# Patient Record
Sex: Male | Born: 1980 | Race: Black or African American | Hispanic: No | Marital: Single | Smoking: Current every day smoker
Health system: Southern US, Community
[De-identification: ages and names within clinical notes are randomized; demographics above are authoritative.]

## PROBLEM LIST (undated history)

## (undated) DIAGNOSIS — E7800 Pure hypercholesterolemia, unspecified: Secondary | ICD-10-CM

---

## 2011-09-13 ENCOUNTER — Emergency Department (HOSPITAL_COMMUNITY)
Admission: EM | Admit: 2011-09-13 | Discharge: 2011-09-13 | Disposition: A | Discharge status: Home / Self Care | Payer: Self-pay | Attending: Emergency Medicine | Admitting: Emergency Medicine

## 2011-09-13 DIAGNOSIS — L2989 Other pruritus: Secondary | ICD-10-CM | POA: Insufficient documentation

## 2011-09-13 DIAGNOSIS — L298 Other pruritus: Secondary | ICD-10-CM | POA: Insufficient documentation

## 2011-09-13 DIAGNOSIS — L259 Unspecified contact dermatitis, unspecified cause: Secondary | ICD-10-CM | POA: Insufficient documentation

## 2012-01-20 ENCOUNTER — Emergency Department (HOSPITAL_COMMUNITY)
Admission: EM | Admit: 2012-01-20 | Discharge: 2012-01-20 | Disposition: A | Discharge status: Home Health | Payer: Self-pay | Attending: Emergency Medicine | Admitting: Emergency Medicine

## 2012-01-20 ENCOUNTER — Encounter (HOSPITAL_COMMUNITY): Payer: Self-pay | Admitting: *Deleted

## 2012-01-20 DIAGNOSIS — K089 Disorder of teeth and supporting structures, unspecified: Secondary | ICD-10-CM | POA: Insufficient documentation

## 2012-01-20 DIAGNOSIS — F17200 Nicotine dependence, unspecified, uncomplicated: Secondary | ICD-10-CM | POA: Insufficient documentation

## 2012-01-20 DIAGNOSIS — K047 Periapical abscess without sinus: Secondary | ICD-10-CM | POA: Insufficient documentation

## 2012-01-20 DIAGNOSIS — R220 Localized swelling, mass and lump, head: Secondary | ICD-10-CM | POA: Insufficient documentation

## 2012-01-20 MED ORDER — IBUPROFEN 600 MG PO TABS: 600.0000 mg | ORAL_TABLET | Freq: Four times a day (QID) | ORAL | Status: AC | PRN

## 2012-01-20 MED ORDER — HYDROCODONE-ACETAMINOPHEN 5-325 MG PO TABS
1.0000 | ORAL_TABLET | Freq: Once | ORAL | Status: AC
  Administered 2012-01-20: 1 via ORAL
  Filled 2012-01-20: qty 1

## 2012-01-20 MED ORDER — CLINDAMYCIN HCL 300 MG PO CAPS: 300.0000 mg | ORAL_CAPSULE | Freq: Four times a day (QID) | ORAL | Status: AC

## 2012-01-20 MED ORDER — HYDROCODONE-ACETAMINOPHEN 5-500 MG PO TABS: 1.0000 | ORAL_TABLET | Freq: Four times a day (QID) | ORAL | Status: AC | PRN

## 2012-01-20 MED ORDER — KETOROLAC TROMETHAMINE 60 MG/2ML IM SOLN
60.0000 mg | Freq: Once | INTRAMUSCULAR | Status: AC
  Administered 2012-01-20: 60 mg via INTRAMUSCULAR
  Filled 2012-01-20: qty 2

## 2012-01-20 NOTE — ED Notes (Signed)
Pt here with pain and swelling to right lower jaw related to broken tooth in right lower jaw.  No sob with this.

## 2012-01-20 NOTE — ED Notes (Signed)
Pain improved stated feels better 6/10 achy dental pain.

## 2012-01-20 NOTE — Discharge Instructions (Signed)
Dental Abscess A dental abscess usually starts from an infected tooth. Antibiotic medicine and pain pills can be helpful, but dental infections require the attention of a dentist. Rinse around the infected area often with salt water (a pinch of salt in 8 oz of warm water). Do not apply heat to the outside of your face. See your dentist or oral surgeon as soon as possible.  SEEK IMMEDIATE MEDICAL CARE IF:  You have increasing, severe pain that is not relieved by medicine.   You or your child has an oral temperature above 102 F (38.9 C), not controlled by medicine.   Your baby is older than 3 months with a rectal temperature of 102 F (38.9 C) or higher.   Your baby is 3 months old or younger with a rectal temperature of 100.4 F (38 C) or higher.   You develop chills, severe headache, difficulty breathing, or trouble swallowing.   You have swelling in the neck or around the eye.  Document Released: 10/28/2005 Document Revised: 10/17/2011 Document Reviewed: 04/08/2007 ExitCare Patient Information 2012 [employer]  RESOURCE GUIDE  Dental Problems  Patients with Medicaid: [facility]                     [facility] [address]                                           [address] Phone:  [phone]                                                  Phone:  [phone]  If unable to pay or uninsured, contact:  [facility] or [facility] to become qualified for the adult dental clinic.  Chronic Pain Problems Contact [facility]  [phone] Patients need to be referred by their primary care doctor.  Insufficient Money for Medicine Contact United Way:  call "211" or [facility] 271-5999.  No Primary Care Doctor Call [facility]  [phone] Other agencies that provide inexpensive medical care    [facility]  [phone]    [facility]  [phone]    [facility]   [phone]    [facility]  [phone]    [facility]  [phone]    [facility]  [phone]  Psychological Services [facility]  [phone] [facility]  [phone] [facility]   800 853-5163 (emergency services [phone])  Substance Abuse Resources [facility]  336-882-2125 [facility] 336-784-9470 [facility] 336-285-9073 [facility] 336-845-3988 Residential & Outpatient Substance Abuse Program  800-659-3381  Abuse/Neglect [place] Child Abuse Hotline (336) 641-3795 [place] [facility] Hotline 800-378-5315 (After Hours)  Emergency Shelter [facility] (336) 271-5985  [facility] (336) 275-9566 [facility] (704) [phone]  MRSA Hotline #:   832-7006    [place] [facility]  [facility] [place]     [employer]                          [facility]   Dept. [address] [place]                       [address]      [address]  [place]                                                [place]                            [place] Phone:  [phone]                                   Phone:  [phone]                 Phone:  [phone]  [facility] Phone:  [phone]  [place] Child Abuse Hotline (336) 342-1394 (336) 342-3537 (After Hours)   

## 2012-01-20 NOTE — ED Provider Notes (Signed)
History     CSN: [id number]  Arrival date & time 01/20/12  1532   First MD Initiated Contact with Patient 01/20/12 1732      Chief Complaint  Patient presents with  . Dental Pain    (Consider location/radiation/quality/duration/timing/severity/associated sxs/prior treatment) Patient is a 31 y.o. male presenting with tooth pain. The history is provided by the patient.  Dental PainPrimary symptoms do not include fever or sore throat. The symptoms began 12 to 24 hours ago (yesterday). The symptoms are unchanged. The symptoms are new. The symptoms occur constantly.  Additional symptoms include: gum swelling, gum tenderness and facial swelling. Additional symptoms do not include: trismus, trouble swallowing, excessive salivation, taste disturbance, smell disturbance and drooling. Associated symptoms comments: Pain with eating.  Marland Kitchen     History reviewed. No pertinent past medical history.  History reviewed. No pertinent past surgical history.  No family history on file.  History  Substance Use Topics  . Smoking status: Current Everyday Smoker    Types: Cigarettes  . Smokeless tobacco: Not on file  . Alcohol Use: No      Review of Systems  Constitutional: Negative for fever.  HENT: Positive for facial swelling. Negative for sore throat, drooling, trouble swallowing, neck pain, neck stiffness and voice change.   Gastrointestinal: Negative for nausea, vomiting, abdominal pain and diarrhea.  Neurological: Negative for speech difficulty.  All other systems reviewed and are negative.    Allergies  Review of patient's allergies indicates no known allergies.  Home Medications  No current outpatient prescriptions on file.  BP 144/95  Pulse 93  Temp(Src) 99.5 F (37.5 C) (Oral)  Resp 13  SpO2 98%  Physical Exam  Nursing note and vitals reviewed. Constitutional: He is oriented to person, place, and time. He appears well-developed and well-nourished. No distress.  HENT:    Head: Normocephalic and atraumatic.  Right Ear: External ear normal.  Left Ear: External ear normal.  Mouth/Throat: Oropharynx is clear and moist.       Slight amount of swelling along right mid jaw line. No trismus or drooling. Small area of fluctuance noted in the gumline adjacent to tooth #30. The tube is nontender to palpation. There is no sublingual fullness or tongue elevation. There is no submental fullness, tenderness or firmness.  Eyes: Pupils are equal, round, and reactive to light.  Neck: Normal range of motion. Neck supple.       No cervical or submandibular lymphadenopathy.  Cardiovascular: Normal rate, regular rhythm, normal heart sounds and intact distal pulses.  Exam reveals no gallop and no friction rub.   No murmur heard. Pulmonary/Chest: Effort normal and breath sounds normal. No respiratory distress. He has no wheezes. He has no rales.  Abdominal: Soft. There is no tenderness. There is no rebound and no guarding.  Musculoskeletal: Normal range of motion. He exhibits no edema and no tenderness.  Lymphadenopathy:    He has no cervical adenopathy.  Neurological: He is alert and oriented to person, place, and time.  Skin: Skin is warm and dry. No rash noted. No erythema.  Psychiatric: He has a normal mood and affect. His behavior is normal.    ED Course  Procedures (including critical care time)      1. Dental abscess       MDM  81:68 PM 31 year old male presenting with right lower dental pain since yesterday. He has had progressively worsening swelling with difficulty eating due to the pain. He denies any fever, nausea, vomiting or headache.  He has an area of fluctuance at the gumline of tooth #30 with tenderness and a mild amount of dental decay. There is no lingual swelling, tongue elevation or submental swelling, tenderness or firmness. Picture is not  consistent with Ludwig's angina. Discussed findings with patient. Offered I&D of abscess which patient refused.  He has opted for antibiotics, analgesics and dentistry follow up. Patient was counseled and given very strong return precautions including progression of swelling or sings and symptoms such as tongue swelling/elevation or tenderness and firmness of submental region. Patient was d/c'd with clindamycin and vicodin and given follow up info for dentistry. Patient voiced understanding and was dc'd home in stable condition.        Sheran Luz, MD 01/21/12 [phone]

## 2012-01-21 NOTE — ED Provider Notes (Signed)
I reviewed the resident's note and I agree with the findings and plan.   Celene Kras, MD 01/21/12 [phone]

## 2012-03-20 ENCOUNTER — Encounter (HOSPITAL_COMMUNITY): Payer: Self-pay

## 2012-03-20 ENCOUNTER — Emergency Department (HOSPITAL_COMMUNITY)
Admission: EM | Admit: 2012-03-20 | Discharge: 2012-03-20 | Disposition: A | Discharge status: Home / Self Care | Payer: Self-pay | Attending: Emergency Medicine | Admitting: Emergency Medicine

## 2012-03-20 DIAGNOSIS — IMO0002 Reserved for concepts with insufficient information to code with codable children: Secondary | ICD-10-CM | POA: Insufficient documentation

## 2012-03-20 DIAGNOSIS — Y9269 Other specified industrial and construction area as the place of occurrence of the external cause: Secondary | ICD-10-CM | POA: Insufficient documentation

## 2012-03-20 DIAGNOSIS — T148XXA Other injury of unspecified body region, initial encounter: Secondary | ICD-10-CM

## 2012-03-20 DIAGNOSIS — X500XXA Overexertion from strenuous movement or load, initial encounter: Secondary | ICD-10-CM | POA: Insufficient documentation

## 2012-03-20 DIAGNOSIS — F17200 Nicotine dependence, unspecified, uncomplicated: Secondary | ICD-10-CM | POA: Insufficient documentation

## 2012-03-20 MED ORDER — IBUPROFEN 200 MG PO TABS
400.0000 mg | ORAL_TABLET | Freq: Once | ORAL | Status: AC
  Administered 2012-03-20: 400 mg via ORAL
  Filled 2012-03-20 (×2): qty 2

## 2012-03-20 MED ORDER — ACETAMINOPHEN 500 MG PO TABS
1000.0000 mg | ORAL_TABLET | Freq: Once | ORAL | Status: AC
  Administered 2012-03-20: 975 mg via ORAL
  Filled 2012-03-20: qty 2

## 2012-03-20 MED ORDER — TRAMADOL HCL 50 MG PO TABS: 50.0000 mg | ORAL_TABLET | Freq: Four times a day (QID) | ORAL | Status: AC | PRN

## 2012-03-20 MED ORDER — ACETAMINOPHEN 325 MG PO TABS
ORAL_TABLET | ORAL | Status: AC
  Filled 2012-03-20: qty 3

## 2012-03-20 MED ORDER — NAPROXEN 250 MG PO TABS: 250.0000 mg | ORAL_TABLET | Freq: Two times a day (BID) | ORAL | Status: DC

## 2012-03-20 NOTE — ED Notes (Signed)
Slight swelling noted to right upper extremity, radial pulses present, no recent injury

## 2012-03-20 NOTE — ED Provider Notes (Signed)
History     CSN: [id number]  Arrival date & time 03/20/12  1054   First MD Initiated Contact with Patient 03/20/12 1107      Chief Complaint  Patient presents with  . Arm Pain    HPI Pt was seen at 1105.  Per pt, c/o gradual onset and persistence of constant right forearm "muscles pain" since yesterday.  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  Pt states yesterday his right forearm "hurt," esp when he was at work "lifting and moving stuff."  Did not take anything for pain. Denies joint pain, no direct injury, no rash, no tingling/numbness in extremity, no focal motor weakness.      History reviewed. No pertinent past medical history.  History reviewed. No pertinent past surgical history.   History  Substance Use Topics  . Smoking status: Current Everyday Smoker    Types: Cigarettes  . Smokeless tobacco: Not on file  . Alcohol Use: No    Review of Systems ROS: Statement: All systems negative except as marked or noted in the HPI; Constitutional: Negative for fever and chills. ; ; Eyes: Negative for eye pain, redness and discharge. ; ; ENMT: Negative for ear pain, hoarseness, nasal congestion, sinus pressure and sore throat. ; ; Cardiovascular: Negative for chest pain, palpitations, diaphoresis, dyspnea and peripheral edema. ; ; Respiratory: Negative for cough, wheezing and stridor. ; ; Gastrointestinal: Negative for nausea, vomiting, diarrhea, abdominal pain, blood in stool, hematemesis, jaundice and rectal bleeding. . ; ; Genitourinary: Negative for dysuria, flank pain and hematuria. ; ; Musculoskeletal: +right forearm pain. Negative for back pain and neck pain. Negative for trauma.; ; Skin: Negative for pruritus, rash, abrasions, blisters, bruising and skin lesion.; ; Neuro: Negative for headache, lightheadedness and neck stiffness. Negative for weakness, altered level of consciousness , altered  mental status, extremity weakness, paresthesias, involuntary movement, seizure and syncope.     Allergies  Review of patient's allergies indicates no known allergies.  Home Medications  No current outpatient prescriptions on file.  BP 134/81  Pulse 76  Temp(Src) 98.3 F (36.8 C) (Oral)  Resp 16  SpO2 100%  Physical Exam 1110: Physical examination:  Nursing notes reviewed; Vital signs and O2 SAT reviewed;  Constitutional: Well developed, Well nourished, Well hydrated, In no acute distress; Head:  Normocephalic, atraumatic; Eyes: EOMI, PERRL, No scleral icterus; ENMT: Mouth and pharynx normal, Mucous membranes moist; Neck: Supple, Full range of motion, No lymphadenopathy; Cardiovascular: Regular rate and rhythm, No murmur, rub, or gallop; Respiratory: Breath sounds clear & equal bilaterally, No rales, rhonchi, wheezes, or rub, Normal respiratory effort/excursion; Chest: Nontender, Movement normal; Extremities: No bony tenderness to palp right shoulder, elbow, wrist, or hand.  Motor strength at shoulder normal.  Sensation intact over deltoid region, distal NMS intact with right hand having intact sensation and strength in the distribution of the median, radial, and ulnar nerve function.  Strong radial pulse.  Right forearm compartments soft.  +TTP right dorsal lateral forearm muscles esp when pt performs right forearm ROM of wrist extension and supination which reproduces pt's pain, No edema/erythema/ecchymosis, no open wounds, no deformity. Pulses normal, No edema, No calf edema or asymmetry.; Neuro: AA&Ox3, Major CN grossly intact.  No gross focal motor or sensory deficits in extremities.; Skin: Color normal, Warm, Dry, no rash.    ED Course  Procedures    MDM  MDM Reviewed: nursing note  and vitals     11:20 AM:  Forearm muscle pain began after spending the day cooking on a grill and using his right arm.  No risk factors for DVT.  No direct injury to arm/joints.  No signs of compartment  syndrome.  Appears msk pain at this time, will tx symptomatically.  Dx d/w pt and family.  Questions answered.  Verb understanding, agreeable to d/c home with outpt f/u.         Laray Anger, DO 03/22/12 1702

## 2012-03-20 NOTE — Discharge Instructions (Signed)
RESOURCE GUIDE  Dental Problems  Patients with Medicaid: [facility]                     [facility] [address]                                           [address] Phone:  [phone]                                                  Phone:  [phone]  If unable to pay or uninsured, contact:  [facility] or [facility] to become qualified for the adult dental clinic.  Chronic Pain Problems Contact [facility]  [phone] Patients need to be referred by their primary care doctor.  Insufficient Money for Medicine Contact [employer] Way:  call "211" or [facility] [phone].  No Primary Care Doctor Call [facility]  [phone] Other agencies that provide inexpensive medical care    [facility]  [phone]    [facility]  [phone]    [facility]  [phone]    [facility]  [phone]    [facility]  [phone]    [facility]  [phone]  Psychological Services [facility]  [phone] [facility]  [phone] [facility]   [phone] (emergency services [phone])  Substance Abuse Resources [facility]  336-882-2125 [facility] 336-784-9470 [facility] 336-285-9073 [facility] 336-845-3988 Residential & Outpatient Substance Abuse Program  800-659-3381  Abuse/Neglect [place] Child Abuse Hotline (336) 641-3795 [place] [facility] Hotline 800-378-5315 (After Hours)  Emergency Shelter [facility] (336) 271-5985  [facility] (336) 275-9566 [facility] (704) 372-4663  MRSA Hotline #:   832-7006    [place] [facility]  [facility] [place]     [employer]                          [facility] [address] [place]                       [address]      [address]  [place]                                                [place]                            [place] Phone:  [phone]                                   Phone:  [phone]                 Phone:  [phone]  [facility] Phone:  [phone]    [place] Child Abuse Hotline (336) 342-1394 (336) 342-3537 (After Hours)    Take the prescriptions as directed.  Apply moist heat or ice to the area(s) of discomfort, for 15 minutes at a time, several times per day for the next few days.  Do not fall asleep on a heating or ice pack.  Call your regular medical doctor today to schedule a follow up appointment within the next week.  Return to the Emergency Department immediately if worsening.  

## 2012-03-20 NOTE — ED Notes (Signed)
Patient presents with swelling to right hand and arm since this AM.  Patient denies recent injury, denies pain upon palpation, just reporting pain upon movement. Radial pulses present, skin intact.

## 2013-01-05 ENCOUNTER — Emergency Department (HOSPITAL_COMMUNITY)
Admission: EM | Admit: 2013-01-05 | Discharge: 2013-01-05 | Disposition: A | Discharge status: Home / Self Care | Payer: Self-pay | Attending: Emergency Medicine | Admitting: Emergency Medicine

## 2013-01-05 ENCOUNTER — Emergency Department (HOSPITAL_COMMUNITY): Payer: Self-pay

## 2013-01-05 ENCOUNTER — Encounter (HOSPITAL_COMMUNITY): Payer: Self-pay | Admitting: Emergency Medicine

## 2013-01-05 DIAGNOSIS — Z8639 Personal history of other endocrine, nutritional and metabolic disease: Secondary | ICD-10-CM | POA: Insufficient documentation

## 2013-01-05 DIAGNOSIS — R51 Headache: Secondary | ICD-10-CM | POA: Insufficient documentation

## 2013-01-05 DIAGNOSIS — Z862 Personal history of diseases of the blood and blood-forming organs and certain disorders involving the immune mechanism: Secondary | ICD-10-CM | POA: Insufficient documentation

## 2013-01-05 DIAGNOSIS — R079 Chest pain, unspecified: Secondary | ICD-10-CM | POA: Insufficient documentation

## 2013-01-05 DIAGNOSIS — F17200 Nicotine dependence, unspecified, uncomplicated: Secondary | ICD-10-CM | POA: Insufficient documentation

## 2013-01-05 DIAGNOSIS — E7800 Pure hypercholesterolemia, unspecified: Secondary | ICD-10-CM | POA: Insufficient documentation

## 2013-01-05 HISTORY — DX: Pure hypercholesterolemia, unspecified: E78.00

## 2013-01-05 LAB — CBC WITH DIFFERENTIAL/PLATELET
Basophils Absolute: 0 10*3/uL (ref 0.0–0.1)
HCT: 43.7 % (ref 39.0–52.0)
Lymphocytes Relative: 34 % (ref 12–46)
Neutro Abs: 2 10*3/uL (ref 1.7–7.7)
Neutrophils Relative %: 53 % (ref 43–77)
Platelets: 300 10*3/uL (ref 150–400)
RDW: 12.6 % (ref 11.5–15.5)
WBC: 3.7 10*3/uL — ABNORMAL LOW (ref 4.0–10.5)

## 2013-01-05 LAB — BASIC METABOLIC PANEL
CO2: 25 mEq/L (ref 19–32)
Chloride: 100 mEq/L (ref 96–112)
Potassium: 4.5 mEq/L (ref 3.5–5.1)
Sodium: 136 mEq/L (ref 135–145)

## 2013-01-05 LAB — POCT I-STAT TROPONIN I: Troponin i, poc: 0 ng/mL (ref 0.00–0.08)

## 2013-01-05 MED ORDER — PROCHLORPERAZINE EDISYLATE 5 MG/ML IJ SOLN
10.0000 mg | Freq: Once | INTRAMUSCULAR | Status: AC
  Administered 2013-01-05: 10 mg via INTRAVENOUS
  Filled 2013-01-05: qty 2

## 2013-01-05 MED ORDER — DEXAMETHASONE SODIUM PHOSPHATE 10 MG/ML IJ SOLN
10.0000 mg | Freq: Once | INTRAMUSCULAR | Status: AC
  Administered 2013-01-05: 10 mg via INTRAVENOUS
  Filled 2013-01-05: qty 1

## 2013-01-05 MED ORDER — DIPHENHYDRAMINE HCL 50 MG/ML IJ SOLN
25.0000 mg | Freq: Once | INTRAMUSCULAR | Status: AC
  Administered 2013-01-05: 25 mg via INTRAVENOUS
  Filled 2013-01-05: qty 1

## 2013-01-05 MED ORDER — KETOROLAC TROMETHAMINE 30 MG/ML IJ SOLN
30.0000 mg | Freq: Once | INTRAMUSCULAR | Status: AC
  Administered 2013-01-05: 30 mg via INTRAVENOUS
  Filled 2013-01-05: qty 1

## 2013-01-05 MED ORDER — SODIUM CHLORIDE 0.9 % IV BOLUS (SEPSIS)
1000.0000 mL | Freq: Once | INTRAVENOUS | Status: AC
  Administered 2013-01-05: 1000 mL via INTRAVENOUS

## 2013-01-05 NOTE — ED Notes (Signed)
Headache sl better directly after receving the meds

## 2013-01-05 NOTE — ED Notes (Signed)
The pt has a headache and chest pain.  He has a history of headaches.  Iv nss and med given

## 2013-01-05 NOTE — ED Notes (Signed)
Pt c/o feeling lightheaded and having HA upon waking today; pt sts some intermittent CP; pt sts hx of similar in past

## 2013-01-05 NOTE — ED Provider Notes (Signed)
History     CSN: [id number]  Arrival date & time 01/05/13  1155   First MD Initiated Contact with Patient 01/05/13 1457      Chief Complaint  Patient presents with  . Chest Pain  . Headache    HPI William Walter is a 32 y.o. male who presents to the ED for concern of headache.  Reports dull throbbing headache over L eye.  Has had these headaches numerous times in the past.  Happens approximately 2x/week.  This one was one of the worse ones however.  Same characteristics as normal headache.  No f/c/ns.  No neck stiffness.  No seizures.  No vision changes.  No weakness/numbness.  No other symptoms.  Patient also says that he has intermittent sharp chest pain that happens with headaches.  R sided.  Lasts less than 5 minutes.  Also very typical and characteristic of headaches and happens at the same side.    Past Medical History  Diagnosis Date  . Hypercholesteremia     History reviewed. No pertinent past surgical history.  Family History: Reviewed.  Migraines in mother.    History  Substance Use Topics  . Smoking status: Current Every Day Smoker    Types: Cigarettes  . Smokeless tobacco: Not on file  . Alcohol Use: Yes     Comment: occ      Review of Systems  Constitutional: Negative for fever and chills.  HENT: Negative for congestion, sore throat and neck pain.   Respiratory: Negative for cough.   Cardiovascular: Positive for chest pain.  Gastrointestinal: Negative for nausea, vomiting, abdominal pain, diarrhea and constipation.  Endocrine: Negative for polyuria.  Genitourinary: Negative for dysuria and hematuria.  Skin: Negative for rash.  Neurological: Positive for headaches. Negative for dizziness, tremors, seizures, syncope, facial asymmetry, speech difficulty, weakness, light-headedness and numbness.  Psychiatric/Behavioral: Negative.   All other systems reviewed and are negative.    Allergies  Review of patient's allergies indicates no known  allergies.  Home Medications  No current outpatient prescriptions on file.  BP 134/79  Pulse 84  Temp(Src) 98.1 F (36.7 C) (Oral)  Resp 18  SpO2 97%  Physical Exam  Nursing note and vitals reviewed. Constitutional: He is oriented to person, place, and time. He appears well-developed and well-nourished. No distress.  HENT:  Head: Normocephalic and atraumatic.  Right Ear: External ear normal.  Left Ear: External ear normal.  Mouth/Throat: Oropharynx is clear and moist. No oropharyngeal exudate.  Eyes: Conjunctivae are normal. Pupils are equal, round, and reactive to light. Right eye exhibits no discharge.  Neck: Normal range of motion. Neck supple. No tracheal deviation present.  Cardiovascular: Normal rate, regular rhythm and intact distal pulses.   Pulmonary/Chest: Effort normal. No respiratory distress. He has no wheezes. He has no rales.  Abdominal: Soft. He exhibits no distension. There is no tenderness. There is no rebound and no guarding.  Musculoskeletal: Normal range of motion.  Neurological: He is alert and oriented to person, place, and time. He has normal strength and normal reflexes. No cranial nerve deficit or sensory deficit. He displays a negative Romberg sign. Coordination and gait normal. GCS eye subscore is 4. GCS verbal subscore is 5. GCS motor subscore is 6.  Skin: Skin is warm and dry. No rash noted. He is not diaphoretic.  Psychiatric: He has a normal mood and affect.    ED Course  Procedures (including critical care time)  Labs Reviewed  CBC WITH DIFFERENTIAL - Abnormal; Notable for  the following:    WBC 3.7 (*)    All other components within normal limits  BASIC METABOLIC PANEL - Abnormal; Notable for the following:    GFR calc non Af Amer 82 (*)    All other components within normal limits  POCT I-STAT TROPONIN I   Dg Chest 2 View  01/05/2013  *RADIOLOGY REPORT*  Clinical Data: Chest pain  CHEST - 2 VIEW  Comparison: None.  Findings:  Lungs clear.   Heart size and pulmonary vascularity are normal.  No adenopathy.  No bone lesions.  No pneumothorax.  IMPRESSION: No abnormality noted.   Original Report Authenticated By: Bretta Bang, M.D.     Date: 01/05/2013  Rate: 70  Rhythm: normal sinus rhythm  QRS Axis: normal  Intervals: normal  ST/T Wave abnormalities: normal and early repolarization  Conduction Disutrbances:none  Narrative Interpretation:   Old EKG Reviewed: none available    1. Headache       MDM  William Walter is a 32 y.o. male who presents to the ED with headache and CP.  CP low risk and sounds to be a side effect of headache.  Not having CP on my exam.  Headache sounds to be c/w undiagnosed migraines.  Family history, unilateral, recurring.  No neuro deficits to indicate stroke.  Atypical story for [facility].  Vision unchanged.  Doubt acute glaucoma.  CP not described as ACS, PE, or other life threatening cause of CP.  EKG, CXR WNL.  Migraine cocktail given and headache improved to 0/10 in severity.  Patient safe for discharge.  Patient currently seeking PCP.  Resource list provided and encouraged patient to f/u regarding further workup.  Patient discharged.       Arloa Koh, MD 01/05/13 1836

## 2013-01-06 NOTE — ED Provider Notes (Signed)
I  reviewed the resident's note and I agree with the findings and plan.     Justice Aguirre L Branson Kranz, MD 01/06/13 1515 

## 2013-09-01 IMAGING — CR DG CHEST 2V
2 series · 2 of 2 positions shown · non-contrast
Comparison: None.

CLINICAL DATA: Chest pain

CHEST - 2 VIEW

[w chest pa]
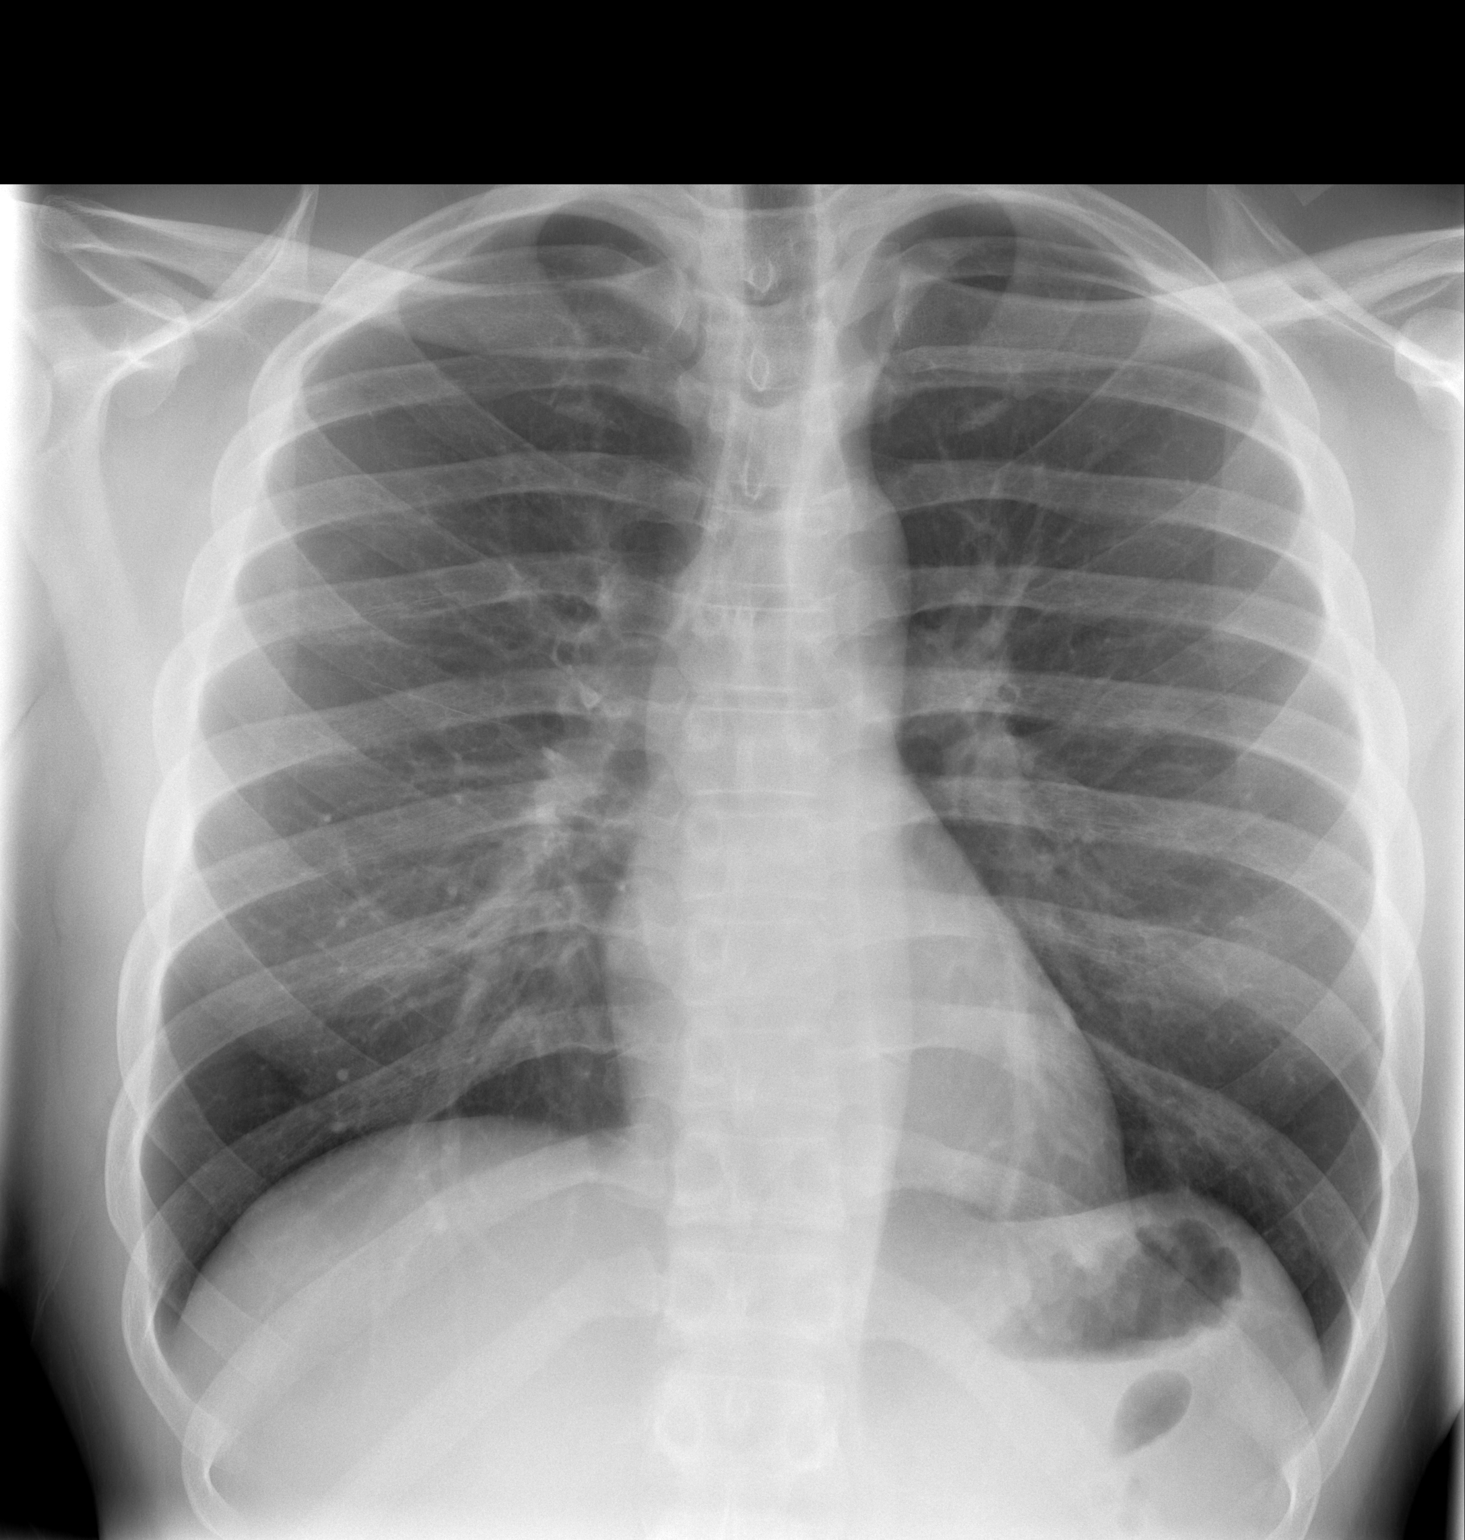

[w chest lat]
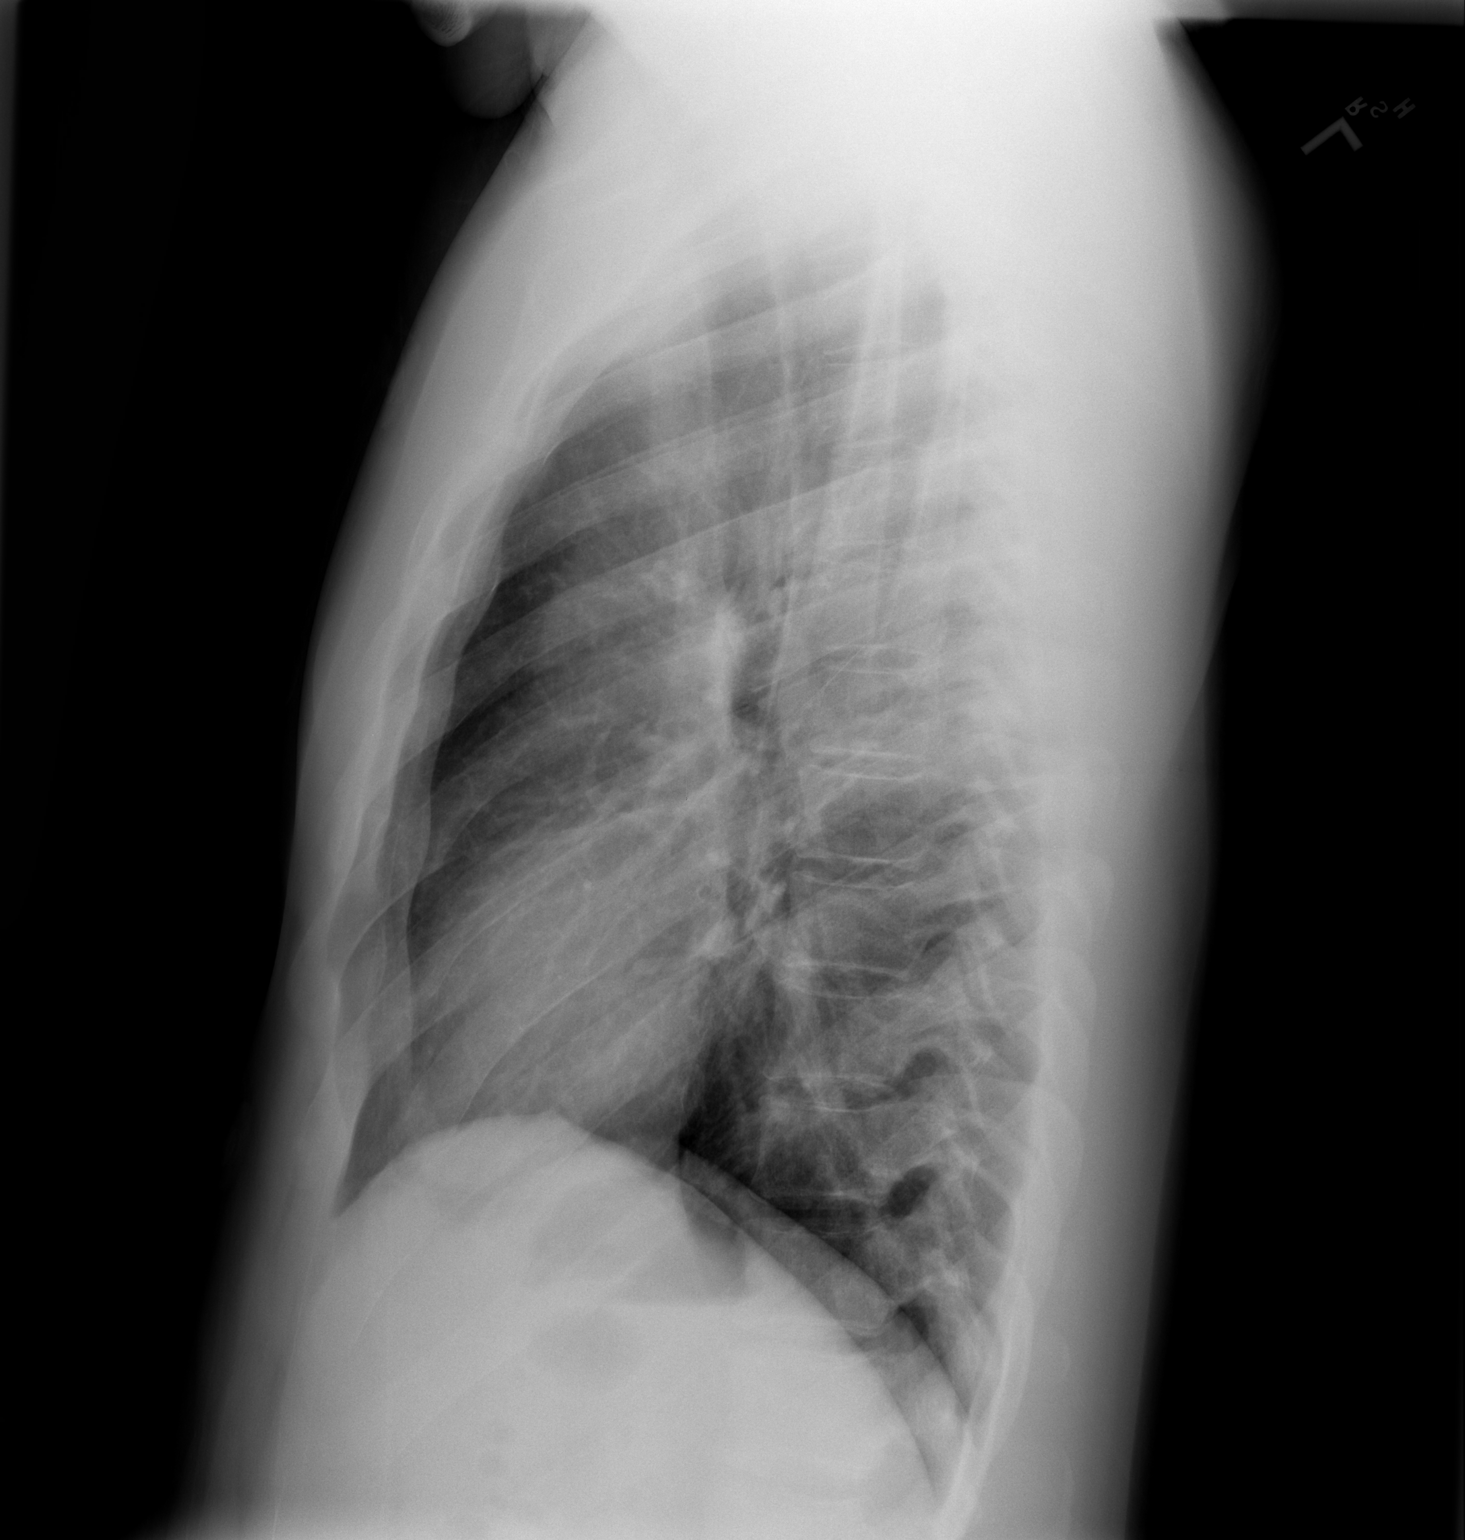

[2 of 2 positions shown; findings below may reference images not displayed]

FINDINGS: ] Lungs clear.  Heart size and pulmonary vascularity are
normal.  No adenopathy.  No bone lesions.  No pneumothorax.
IMPRESSION: No abnormality noted.

## 2013-11-06 ENCOUNTER — Encounter (HOSPITAL_COMMUNITY): Payer: Self-pay | Admitting: Emergency Medicine

## 2013-11-06 ENCOUNTER — Emergency Department (HOSPITAL_COMMUNITY)
Admission: EM | Admit: 2013-11-06 | Discharge: 2013-11-06 | Disposition: A | Discharge status: Home / Self Care | Payer: Self-pay | Attending: Emergency Medicine | Admitting: Emergency Medicine

## 2013-11-06 DIAGNOSIS — Y929 Unspecified place or not applicable: Secondary | ICD-10-CM | POA: Insufficient documentation

## 2013-11-06 DIAGNOSIS — X503XXA Overexertion from repetitive movements, initial encounter: Secondary | ICD-10-CM | POA: Insufficient documentation

## 2013-11-06 DIAGNOSIS — Z862 Personal history of diseases of the blood and blood-forming organs and certain disorders involving the immune mechanism: Secondary | ICD-10-CM | POA: Insufficient documentation

## 2013-11-06 DIAGNOSIS — S335XXA Sprain of ligaments of lumbar spine, initial encounter: Secondary | ICD-10-CM | POA: Insufficient documentation

## 2013-11-06 DIAGNOSIS — Z8639 Personal history of other endocrine, nutritional and metabolic disease: Secondary | ICD-10-CM | POA: Insufficient documentation

## 2013-11-06 DIAGNOSIS — Y9389 Activity, other specified: Secondary | ICD-10-CM | POA: Insufficient documentation

## 2013-11-06 DIAGNOSIS — M549 Dorsalgia, unspecified: Secondary | ICD-10-CM

## 2013-11-06 MED ORDER — IBUPROFEN 800 MG PO TABS: 800.0000 mg | ORAL_TABLET | Freq: Three times a day (TID) | ORAL | Status: DC

## 2013-11-06 MED ORDER — HYDROCODONE-ACETAMINOPHEN 5-325 MG PO TABS: 1.0000 | ORAL_TABLET | ORAL | Status: DC | PRN

## 2013-11-06 MED ORDER — CYCLOBENZAPRINE HCL 10 MG PO TABS: 10.0000 mg | ORAL_TABLET | Freq: Two times a day (BID) | ORAL | Status: DC | PRN

## 2013-11-06 MED ORDER — HYDROCODONE-ACETAMINOPHEN 5-325 MG PO TABS
1.0000 | ORAL_TABLET | Freq: Once | ORAL | Status: AC
  Administered 2013-11-06: 1 via ORAL
  Filled 2013-11-06: qty 1

## 2013-11-06 NOTE — ED Provider Notes (Signed)
CSN: [id number]     Arrival date & time 11/06/13  1409 History   This chart was scribed for non-physician practitioner Allean Found, PA-C working with Hurman Horn, MD by Valera Castle, ED scribe. This patient was seen in room TR08C/TR08C and the patient's care was started at 4:52 PM.   Chief Complaint  Patient presents with  . Back Pain   The history is provided by the patient. No language interpreter was used.   HPI Comments: William Walter is a 32 y.o. male who presents to the Emergency Department complaining of sudden, moderate, intermittent, lower back pain, onset a few days ago. He reports that he helped push a car out of the mud and thinks the back pain came from that. He reports pain with certain movement, including bending over. He denies abdominal pain, urinary symptoms, and any other associated symptoms. He denies any medical history.   PCP - Default, Provider, MD  Past Medical History  Diagnosis Date  . Hypercholesteremia    History reviewed. No pertinent past surgical history. History reviewed. No pertinent family history. History  Substance Use Topics  . Smoking status: Current Every Day Smoker    Types: Cigarettes  . Smokeless tobacco: Not on file  . Alcohol Use: Yes     Comment: occ    Review of Systems  Gastrointestinal: Negative for abdominal pain.  Genitourinary: Negative.  Negative for dysuria.  Musculoskeletal: Positive for back pain (lower) and myalgias. Negative for gait problem.  All other systems reviewed and are negative.    Allergies  Review of patient's allergies indicates no known allergies.  Home Medications   Current Outpatient Rx  Name  Route  Sig  Dispense  Refill  . ibuprofen (ADVIL,MOTRIN) 200 MG tablet   Oral   Take 600 mg by mouth every 4 (four) hours as needed.          BP 126/78  Pulse 88  Temp(Src) 98.8 F (37.1 C) (Oral)  Resp 20  Wt 216 lb 3 oz (98.062 kg)  SpO2 99%  Physical Exam  Nursing note and vitals  reviewed. Constitutional: He is oriented to person, place, and time. He appears well-developed and well-nourished. No distress.  HENT:  Head: Normocephalic and atraumatic.  Eyes: EOM are normal.  Neck: Neck supple. No tracheal deviation present.  Cardiovascular: Normal rate.   Pulmonary/Chest: Effort normal. No respiratory distress.  Abdominal: Soft. There is no tenderness.  Musculoskeletal: Normal range of motion.  Right para lumbar tenderness.   Neurological: He is alert and oriented to person, place, and time.  Equal LE strength.   Skin: Skin is warm and dry.  Psychiatric: He has a normal mood and affect. His behavior is normal.    ED Course  Procedures (including critical care time)  DIAGNOSTIC STUDIES: Oxygen Saturation is 99% on room air, normal by my interpretation.    COORDINATION OF CARE: 4:53 PM-Discussed treatment plan which includes clinical suspicion of muscle strain with pt at bedside and pt agreed to plan.   Labs Review Labs Reviewed - No data to display Imaging Review No results found.  EKG Interpretation   None      Meds ordered this encounter  Medications  . ibuprofen (ADVIL,MOTRIN) 200 MG tablet    Sig: Take 600 mg by mouth every 4 (four) hours as needed.    MDM  No diagnosis found. 1. Back strain  Uncomplicated back strain injury.  I personally performed the services described in this documentation, which was  scribed in my presence. The recorded information has been reviewed and is accurate.     Arnoldo Hooker, PA-C 11/06/13 1715

## 2013-11-06 NOTE — ED Notes (Signed)
Per pt sts Christmas Eve he was helping push a car out of the mudd and he thinks he injured his back. sts lower back pain with movement.

## 2013-11-06 NOTE — ED Notes (Signed)
PT ambulatory to room

## 2013-11-08 NOTE — ED Provider Notes (Signed)
Medical screening examination/treatment/procedure(s) were performed by non-physician practitioner and as supervising physician I was immediately available for consultation/collaboration.   Aswad Wandrey M Donika Butner, MD 11/08/13 1410 

## 2014-03-16 ENCOUNTER — Encounter (HOSPITAL_COMMUNITY): Payer: Self-pay | Admitting: Emergency Medicine

## 2014-03-16 ENCOUNTER — Emergency Department (HOSPITAL_COMMUNITY)
Admission: EM | Admit: 2014-03-16 | Discharge: 2014-03-17 | Disposition: A | Discharge status: Home / Self Care | Payer: Self-pay | Attending: Emergency Medicine | Admitting: Emergency Medicine

## 2014-03-16 DIAGNOSIS — T4271XA Poisoning by unspecified antiepileptic and sedative-hypnotic drugs, accidental (unintentional), initial encounter: Secondary | ICD-10-CM | POA: Insufficient documentation

## 2014-03-16 DIAGNOSIS — E7800 Pure hypercholesterolemia, unspecified: Secondary | ICD-10-CM

## 2014-03-16 DIAGNOSIS — G4700 Insomnia, unspecified: Secondary | ICD-10-CM

## 2014-03-16 DIAGNOSIS — Z862 Personal history of diseases of the blood and blood-forming organs and certain disorders involving the immune mechanism: Secondary | ICD-10-CM | POA: Insufficient documentation

## 2014-03-16 DIAGNOSIS — T4272XA Poisoning by unspecified antiepileptic and sedative-hypnotic drugs, intentional self-harm, initial encounter: Secondary | ICD-10-CM | POA: Insufficient documentation

## 2014-03-16 DIAGNOSIS — F17200 Nicotine dependence, unspecified, uncomplicated: Secondary | ICD-10-CM | POA: Insufficient documentation

## 2014-03-16 DIAGNOSIS — F1910 Other psychoactive substance abuse, uncomplicated: Secondary | ICD-10-CM

## 2014-03-16 DIAGNOSIS — Z8639 Personal history of other endocrine, nutritional and metabolic disease: Secondary | ICD-10-CM | POA: Insufficient documentation

## 2014-03-16 DIAGNOSIS — R45851 Suicidal ideations: Secondary | ICD-10-CM

## 2014-03-16 DIAGNOSIS — T50902A Poisoning by unspecified drugs, medicaments and biological substances, intentional self-harm, initial encounter: Secondary | ICD-10-CM

## 2014-03-16 DIAGNOSIS — T426X2A Poisoning by other antiepileptic and sedative-hypnotic drugs, intentional self-harm, initial encounter: Secondary | ICD-10-CM | POA: Insufficient documentation

## 2014-03-16 LAB — RAPID URINE DRUG SCREEN, HOSP PERFORMED
AMPHETAMINES: NOT DETECTED
BENZODIAZEPINES: NOT DETECTED
Barbiturates: NOT DETECTED
Cocaine: POSITIVE — AB
OPIATES: NOT DETECTED
TETRAHYDROCANNABINOL: POSITIVE — AB

## 2014-03-16 LAB — ACETAMINOPHEN LEVEL

## 2014-03-16 LAB — ETHANOL: Alcohol, Ethyl (B): <11 mg/dL (ref 0–11)

## 2014-03-16 LAB — COMPREHENSIVE METABOLIC PANEL
ALK PHOS: 53 U/L (ref 39–117)
ALT: 31 U/L (ref 0–53)
AST: 43 U/L — ABNORMAL HIGH (ref 0–37)
Albumin: 4.6 g/dL (ref 3.5–5.2)
BUN: 8 mg/dL (ref 6–23)
CO2: 24 mEq/L (ref 19–32)
CREATININE: 1.14 mg/dL (ref 0.50–1.35)
Calcium: 9.5 mg/dL (ref 8.4–10.5)
Chloride: 99 mEq/L (ref 96–112)
GFR calc Af Amer: >90 mL/min (ref >90)
GFR calc non Af Amer: 84 mL/min — ABNORMAL LOW (ref >90)
GLUCOSE: 84 mg/dL (ref 70–99)
POTASSIUM: 4 meq/L (ref 3.7–5.3)
Sodium: 139 mEq/L (ref 137–147)
TOTAL PROTEIN: 8.7 g/dL — AB (ref 6.0–8.3)
Total Bilirubin: 1.2 mg/dL (ref 0.3–1.2)

## 2014-03-16 LAB — CBC
HEMATOCRIT: 42.6 % (ref 39.0–52.0)
HEMOGLOBIN: 14.8 g/dL (ref 13.0–17.0)
MCH: 30.2 pg (ref 26.0–34.0)
MCHC: 34.7 g/dL (ref 30.0–36.0)
MCV: 86.9 fL (ref 78.0–100.0)
Platelets: 269 10*3/uL (ref 150–400)
RBC: 4.9 MIL/uL (ref 4.22–5.81)
RDW: 12.8 % (ref 11.5–15.5)
WBC: 3.6 10*3/uL — ABNORMAL LOW (ref 4.0–10.5)

## 2014-03-16 LAB — SALICYLATE LEVEL: Salicylate Lvl: <2 mg/dL — ABNORMAL LOW (ref 2.8–20.0)

## 2014-03-16 MED ORDER — IBUPROFEN 200 MG PO TABS: 600.0000 mg | ORAL_TABLET | Freq: Three times a day (TID) | ORAL | Status: DC | PRN

## 2014-03-16 MED ORDER — ONDANSETRON HCL 4 MG PO TABS: 4.0000 mg | ORAL_TABLET | Freq: Three times a day (TID) | ORAL | Status: DC | PRN

## 2014-03-16 MED ORDER — ACETAMINOPHEN 325 MG PO TABS: 650.0000 mg | ORAL_TABLET | ORAL | Status: DC | PRN

## 2014-03-16 MED ORDER — NICOTINE 21 MG/24HR TD PT24: 21.0000 mg | MEDICATED_PATCH | Freq: Every day | TRANSDERMAL | Status: DC

## 2014-03-16 MED ORDER — ALUM & MAG HYDROXIDE-SIMETH 200-200-20 MG/5ML PO SUSP: 30.0000 mL | ORAL | Status: DC | PRN

## 2014-03-16 NOTE — ED Notes (Signed)
Belongings under nurses station desk in front of room 17
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
## 2014-03-16 NOTE — ED Notes (Signed)
Pt moved to RM41 at this time, ambulatory without issue.  Belongings transferred also.  NAD upon transfer of care.

## 2014-03-16 NOTE — ED Notes (Signed)
Stephanie from poison control called and reported that she will close out the case.

## 2014-03-16 NOTE — ED Notes (Addendum)
Poison control recommends supportive care, can cause drowsiness possibly confusion. Would see significant if any changes in 3-4 hours. Medicine is short acting.
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
## 2014-03-16 NOTE — ED Notes (Signed)
Pt sleeping soundly, arousable to verbal stim.  Denies needs/complaints at this time.  NAD.
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
## 2014-03-17 ENCOUNTER — Encounter (HOSPITAL_COMMUNITY): Payer: Self-pay | Admitting: Registered Nurse

## 2014-03-17 DIAGNOSIS — F1994 Other psychoactive substance use, unspecified with psychoactive substance-induced mood disorder: Secondary | ICD-10-CM

## 2014-03-17 DIAGNOSIS — F1910 Other psychoactive substance abuse, uncomplicated: Secondary | ICD-10-CM | POA: Diagnosis present

## 2014-03-17 DIAGNOSIS — R45851 Suicidal ideations: Secondary | ICD-10-CM

## 2014-03-17 DIAGNOSIS — G4700 Insomnia, unspecified: Secondary | ICD-10-CM | POA: Diagnosis present

## 2014-03-17 NOTE — Consult Note (Signed)
Face to face evaluation and I agree with this note 
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
## 2014-03-17 NOTE — Discharge Instructions (Signed)
Drug Abuse and Addiction in Sports There are many types of drugs that one may become addicted to including illegal drugs (marijuana, cocaine, amphetamines, hallucinogens, and narcotics), prescription drugs (hydrocodone, codeine, and alprazolam), and other chemicals such as alcohol or nicotine. Two types of addiction exist: physical and emotional. Physical addiction usually occurs after prolonged use of a drug. However, some drugs may only take a couple uses before addiction can occur. Physical addiction is marked by withdrawal symptoms, in which the person experiences negative symptoms such as sweat, anxiety, tremors, hallucinations, or cravings in the absence of using the drug. Emotional dependence is the psychological desire for the "high" that the drugs produce when taken. SYMPTOMS   Inattentiveness.  Negligence.  Forgetfulness.  Insomnia.  Mood swings. RISK INCREASES WITH:   Family history of addiction.  Personal history of addictive personality. Studies have shown that risktakers, which many athletes are, have a higher risk of addiction. PREVENTION The only adequate prevention of drug abuse is abstinence from drugs. TREATMENT  The first step in quitting substance abuse is recognizing the problem and realizing that one has the power to change. Quitting requires a plan and support from others. It is often necessary to seek medical assistance. Caregivers are available to offer counseling, and for certain cases, medicine to diminish the physical symptoms of withdrawal. Many organizations exist such as Alcoholics Anonymous, Narcotics Anonymous, or the [employer] on Alcoholism that offer support for individuals who have chosen to quit their habits. Document Released: 10/28/2005 Document Revised: 01/20/2012 Document Reviewed: 02/09/2009 [facility] Patient Information 2014 [place], [place].  Emotional Crisis Part of your problem today may be due to an emotional crisis. Emotional states  can cause many different physical signs and symptoms. These may include:  Chest or stomach pain.  Fluttering heartbeat.  Passing out.  Breathing difficulty.  Headaches.  Trembling.  Hot or cold flashes.  Numbness.  Dizziness.  Unusual muscle pain or fatigue.  Insomnia. When you have other medical problems, they are often made worse by emotional upsets. Emotional crises can increase your stress and anxiety. Finding ways to reduce your stress level can make you feel better. You will become more capable of dealing with these emotional states. Regular physical exercise such as walking can be very beneficial. Counseling or medicine to treat anxiety or depression may also be needed. See your caregiver if you have further problems or questions about your condition. Document Released: 10/28/2005 Document Revised: 01/20/2012 Document Reviewed: 04/14/2007 [facility] Patient Information 2014 [place], [place].  Stress Management Stress is a state of physical or mental tension that often results from changes in your life or normal routine. Some common causes of stress are:  Death of a loved one.  Injuries or severe illnesses.  Getting fired or changing jobs.  Moving into a new home. Other causes may be:  Sexual problems.  Business or financial losses.  Taking on a large debt.  Regular conflict with someone at home or at work.  Constant tiredness from lack of sleep. It is not just bad things that are stressful. It may be stressful to:  Win the lottery.  Get married.  Buy a new car. The amount of stress that can be easily tolerated varies from person to person. Changes generally cause stress, regardless of the types of change. Too much stress can affect your health. It may lead to physical or emotional problems. Too little stress (boredom) may also become stressful. SUGGESTIONS TO REDUCE STRESS:  Talk things over with your family and  friends. It often is helpful to share your  concerns and worries. If you feel your problem is serious, you may want to get help from a professional counselor.  Consider your problems one at a time instead of lumping them all together. Trying to take care of everything at once may seem impossible. List all the things you need to do and then start with the most important one. Set a goal to accomplish 2 or 3 things each day. If you expect to do too many in a single day you will naturally fail, causing you to feel even more stressed.  Do not use alcohol or drugs to relieve stress. Although you may feel better for a short time, they do not remove the problems that caused the stress. They can also be habit forming.  Exercise regularly - at least 3 times per week. Physical exercise can help to relieve that "uptight" feeling and will relax you.  The shortest distance between despair and hope is often a good night's sleep.  Go to bed and get up on time allowing yourself time for appointments without being rushed.  Take a short "time-out" period from any stressful situation that occurs during the day. Close your eyes and take some deep breaths. Starting with the muscles in your face, tense them, hold it for a few seconds, then relax. Repeat this with the muscles in your neck, shoulders, hand, stomach, back and legs.  Take good care of yourself. Eat a balanced diet and get plenty of rest.  Schedule time for having fun. Take a break from your daily routine to relax. HOME CARE INSTRUCTIONS   Call if you feel overwhelmed by your problems and feel you can no longer manage them on your own.  Return immediately if you feel like hurting yourself or someone else. Document Released: 04/23/2001 Document Revised: 01/20/2012 Document Reviewed: 06/22/2013 [facility] Patient Information 2014 [place].  Suicidal Feelings, How to Help Yourself Everyone feels sad or unhappy at times, but depressing thoughts and feelings of hopelessness can lead to thoughts  of suicide. It can seem as if life is too tough to handle. If you feel as though you have reached the point where suicide is the only answer, it is time to let someone know immediately.  HOW TO COPE AND PREVENT SUICIDE  Let family, friends, teachers, or counselors know. Get help. Try not to isolate yourself from those who care about you. Even though you may not feel sociable, talk with someone every day. It is best if it is face-to-face. Remember, they will want to help you.  Eat a regularly spaced and well-balanced diet.  Get plenty of rest.  Avoid alcohol and drugs because they will only make you feel worse and may also lower your inhibitions. Remove them from the home. If you are thinking of taking an overdose of your prescribed medicines, give your medicines to someone who can give them to you one day at a time. If you are on antidepressants, let your caregiver know of your feelings so he or she can provide a safer medicine, if that is a concern.  Remove weapons or poisons from your home.  Try to stick to routines. Follow a schedule and remind yourself that you have to keep that schedule every day.  Set some realistic goals and achieve them. Make a list and cross things off as you go. Accomplishments give a sense of worth. Wait until you are feeling better before doing things you find difficult  or unpleasant to do.  If you are able, try to start exercising. Even half-hour periods of exercise each day will make you feel better. Getting out in the sun or into nature helps you recover from depression faster. If you have a favorite place to walk, take advantage of that.  Increase safe activities that have always given you pleasure. This may include playing your favorite music, reading a good book, painting a picture, or playing your favorite instrument. Do whatever takes your mind off your depression.  Keep your living space well-lighted. GET HELP Contact a suicide hotline, crisis center, or  local suicide prevention center for help right away. Local centers may include a hospital, clinic, community service organization, social service provider, or health department.  Call your local emergency services (911 in the [place]).  Call a suicide hotline:  1-800-273-TALK ((812)101-02921-4423879587) in the [place].  1-800-SUICIDE 315-650-4735(1-209-481-9702) in the [place].  509-643-10061-(260) 532-3244 in the [place] for Spanish-speaking counselors.  4-696-295-2WUX1-800-799-4TTY 559-596-0660(1-6397739476) in the [place] for TTY users.  Visit the following websites for information and help:  National Suicide Prevention Lifeline: www.suicidepreventionlifeline.org  Hopeline: www.hopeline.com  [employer] for Suicide Prevention: https://www.ayers.com/www.afsp.org  For lesbian, gay, bisexual, transgender, or questioning youth, contact [employer]:  3-664-4-I-HKVQQV1-866-4-U-TREVOR 6820790987(1-9704615365) in the [place].  www.thetrevorproject.org  In [place], treatment resources are listed in each province with listings available under [employer] [facility] or similar titles. Another source for Crisis Centres by [place] is located at http://www.suicideprevention.ca/in-crisis-now/find-a-crisis-centre-now/crisis-centres Document Released: 05/04/2003 Document Revised: 01/20/2012 Document Reviewed: 09/22/2007 [facility] Patient Information 2014 [place], [place].

## 2014-03-17 NOTE — Progress Notes (Signed)
Per Denice BorsShuvon NP and Dr. Ladona Ridgelaylor the patient is psychiatrically stable and will be discharged with outpatient resources.

## 2014-03-17 NOTE — BH Assessment (Signed)
Tele Assessment Note   William Walter is an 33 y.o. male.  -Clinician talked to Dr. Wilkie AyeHorton.  She was not that familiar with patient.  Dr. Dellie BurnsLinker's note says that patient said that he  had intentionally tried to harm himself by taking four Ambien after an argument with girlfriend.  Patient said that he had taken two [place] because he has trouble sleeping.  Patient reports taking two pills before having argument with girlfriend.  He said that he took two more after the argument, amounting to 4 pills total.  Patient said that he did not intend to kill himself but rather wanted to sleep.  Patient currently denies any SI, intention or plan to kill himself.  Patient denies any HI or A/V hallucinations.  No prior attempts to kill self.  Pt is able to contract for safety at this time.  Patient does have SA issues.  He drinks a few beers or mixed drinks on the weekends.  Patient says that he uses marijuana in the amount of 5-6 blunts per day.  He denies current cocaine use but his UDS is positive for it.  Patient has felon record from previous arrest for possession and distribution of cocaine back in 2007.  -Pt care discussed with Donell SievertSpencer Simon, PA who felt that patient may be able to contract for safety then pursue outpatient referrals.  Clinician talked to Dr. Wilkie AyeHorton who favors having psychiatry see patient in the AM.  This is due to Dr. Karma GanjaLinker documenting that patient had said that he had intentionally taken the medication to OD.  Therefore psychiatrist or extender will see patient in the AM on 05/07.  Axis I: Substance Induced Mood Disorder Axis II: Deferred Axis III:  Past Medical History  Diagnosis Date  . Hypercholesteremia    Axis IV: economic problems, occupational problems and problems related to legal system/crime Axis V: 51-60 moderate symptoms  Past Medical History:  Past Medical History  Diagnosis Date  . Hypercholesteremia     History reviewed. No pertinent past surgical  history.  Family History: History reviewed. No pertinent family history.  Social History:  reports that he has been smoking Cigarettes.  He has been smoking about 0.00 packs per day. He does not have any smokeless tobacco history on file. He reports that he drinks alcohol. He reports that he uses illicit drugs (Marijuana).  Additional Social History:  Alcohol / Drug Use Pain Medications: None Prescriptions: None Over the Counter: NOne History of alcohol / drug use?: Yes Negative Consequences of Use: Legal  CIWA: CIWA-Ar BP: 129/81 mmHg Pulse Rate: 51 COWS:    Allergies: No Known Allergies  Home Medications:  (Not in a hospital admission)  OB/GYN Status:  No LMP for male patient.  General Assessment Data Location of Assessment: [facility] ED Is this a Tele or Face-to-Face Assessment?: Tele Assessment Is this an Initial Assessment or a Re-assessment for this encounter?: Initial Assessment Living Arrangements: Parent (Stays with mother and sometimes w/ girlfriend) Can pt return to current living arrangement?: Yes Admission Status: Voluntary Is patient capable of signing voluntary admission?: Yes Transfer from: Acute Hospital Referral Source: Self[facility]Family/Friend     [facility] Living Arrangements: Parent (Stays with mother and sometimes w/ girlfriend) Name of Psychiatrist: None Name of Therapist: NOne     Risk to self Suicidal Ideation: No Suicidal Intent: No (Dr. Dellie BurnsLinker's note says pt admitted it was intentional.  ) Is patient at risk for suicide?: Yes Suicidal Plan?: No (Pt did take four  ambien over a few hours.  Denies it was sui) Access to Means: Yes Specify Access to Suicidal Means: Meds in home' What has been your use of drugs/alcohol within the last 12 months?: THC & cocaine in system. Previous Attempts/Gestures: No How many times?: 0 Other Self Harm Risks: None Triggers for Past Attempts: None known Intentional Self Injurious Behavior: None Family  Suicide History: No Recent stressful life event(s): Conflict (Comment);Job Loss (Argument w/ gf.  Is looking for a job also.) Persecutory voices/beliefs?: No Depression: No Depression Symptoms:  (Denies any depressive symptoms.) Substance abuse history and/or treatment for substance abuse?: Yes Suicide prevention information given to non-admitted patients: Not applicable  Risk to Others Homicidal Ideation: No Thoughts of Harm to Others: No Current Homicidal Intent: No Current Homicidal Plan: No Access to Homicidal Means: No Identified Victim: No one History of harm to others?: No Assessment of Violence: None Noted Violent Behavior Description: Pt denies hx of fights. Does patient have access to weapons?: No (Pt has a history of felony offense.  Can't have weapons.) Criminal Charges Pending?: No Does patient have a court date: No  Psychosis Hallucinations: None noted Delusions: None noted  Mental Status Report Appear/Hygiene:  (Casual) Eye Contact: Poor Motor Activity: Freedom of movement;Unremarkable Speech: Logical/coherent Level of Consciousness: Alert Mood: Other (Comment) (Casual) Affect: Inconsistent with thought content;Appropriate to circumstance Anxiety Level: None Thought Processes: Coherent;Relevant Judgement: Unimpaired Orientation: Person;Place;Time;Situation Obsessive Compulsive Thoughts/Behaviors: None  Cognitive Functioning Concentration: Normal Memory: Remote Intact;Recent Impaired IQ: Average Insight: Fair Impulse Control: Poor Appetite: Good Weight Loss: 0 Weight Gain: 0 Sleep: Decreased Total Hours of Sleep:  (5-6 hours per day) Vegetative Symptoms: None  ADLScreening [facility]) Patient's cognitive ability adequate to safely complete daily activities?: Yes Patient able to express need for assistance with ADLs?: Yes Independently performs ADLs?: Yes (appropriate for developmental age)  Prior Inpatient Therapy Prior Inpatient  Therapy: No Prior Therapy Dates: N/A Prior Therapy Facilty/Provider(s): None Reason for Treatment: None  Prior Outpatient Therapy Prior Outpatient Therapy: No Prior Therapy Dates: None Prior Therapy Facilty/Provider(s): None Reason for Treatment: None  ADL Screening (condition at time of admission) Patient's cognitive ability adequate to safely complete daily activities?: Yes Is the patient deaf or have difficulty hearing?: No Does the patient have difficulty seeing, even when wearing glasses/contacts?: No Does the patient have difficulty concentrating, remembering, or making decisions?: No Patient able to express need for assistance with ADLs?: Yes Does the patient have difficulty dressing or bathing?: No Independently performs ADLs?: Yes (appropriate for developmental age) Does the patient have difficulty walking or climbing stairs?: No Weakness of Legs: None Weakness of Arms/Hands: None       Abuse/Neglect Assessment (Assessment to be complete while patient is alone) Physical Abuse: Denies Verbal Abuse: Denies Sexual Abuse: Denies Exploitation of patient/patient's resources: Denies Self-Neglect: Denies     Merchant navy officerAdvance Directives (For Healthcare) Advance Directive: Patient does not have advance directive;Patient would not like information Pre-existing out of facility DNR order (yellow form or pink MOST form): No    Additional Information 1:1 In Past 12 Months?: No CIRT Risk: No Elopement Risk: No Does patient have medical clearance?: Yes     Disposition:  Disposition Initial Assessment Completed for this Encounter: Yes Disposition of Patient: Other dispositions Other disposition(s): Other (Comment) (Pt needs to be seen by psychiatry in AM on 05/07)  Bubba CampMarcus R Tait Walter 03/17/2014 3:59 AM

## 2014-06-17 ENCOUNTER — Encounter (HOSPITAL_COMMUNITY): Payer: Self-pay | Admitting: Emergency Medicine

## 2014-06-17 ENCOUNTER — Emergency Department (HOSPITAL_COMMUNITY)
Admission: EM | Admit: 2014-06-17 | Discharge: 2014-06-17 | Disposition: A | Discharge status: Home / Self Care | Payer: Self-pay | Attending: Emergency Medicine | Admitting: Emergency Medicine

## 2014-06-17 DIAGNOSIS — Z79899 Other long term (current) drug therapy: Secondary | ICD-10-CM | POA: Insufficient documentation

## 2014-06-17 DIAGNOSIS — Z791 Long term (current) use of non-steroidal anti-inflammatories (NSAID): Secondary | ICD-10-CM | POA: Insufficient documentation

## 2014-06-17 DIAGNOSIS — M5442 Lumbago with sciatica, left side: Secondary | ICD-10-CM

## 2014-06-17 DIAGNOSIS — M545 Low back pain, unspecified: Secondary | ICD-10-CM | POA: Insufficient documentation

## 2014-06-17 DIAGNOSIS — Z862 Personal history of diseases of the blood and blood-forming organs and certain disorders involving the immune mechanism: Secondary | ICD-10-CM | POA: Insufficient documentation

## 2014-06-17 DIAGNOSIS — Z8639 Personal history of other endocrine, nutritional and metabolic disease: Secondary | ICD-10-CM | POA: Insufficient documentation

## 2014-06-17 DIAGNOSIS — F17200 Nicotine dependence, unspecified, uncomplicated: Secondary | ICD-10-CM | POA: Insufficient documentation

## 2014-06-17 MED ORDER — CYCLOBENZAPRINE HCL 10 MG PO TABS: 10.0000 mg | ORAL_TABLET | Freq: Two times a day (BID) | ORAL | Status: AC | PRN

## 2014-06-17 MED ORDER — MELOXICAM 7.5 MG PO TABS: 15.0000 mg | ORAL_TABLET | Freq: Every day | ORAL | Status: AC

## 2014-06-17 NOTE — ED Provider Notes (Signed)
CSN: [id number]     Arrival date & time 06/17/14  1225 History  This chart was scribed for non-physician practitioner working with No att. providers found by Elveria Risingimelie Horne, ED Scribe. This patient was seen in room TR06C/TR06C and the patient's care was started at 12:42 PM.   Chief Complaint  Patient presents with  . Back Pain     The history is provided by the patient. No language interpreter was used.   HPI Comments: William Walter is a 33 y.o. male who presents to the Emergency Department complaining of right lower back pain with radiation into his right leg from his knee to his groin, onset last night and worsening this morning. Pain is 10/10 at worst. Patient reports repetitive heavy lifting at work.  Patient reports history of similar symptoms when pulling a muscle in 10/2013. Patient is ambulatory with pain.  Patient denies fever or urinary symptoms.  Patient denies history of back surgery, IV drug use or cancer.   Past Medical History  Diagnosis Date  . Hypercholesteremia    History reviewed. No pertinent past surgical history. History reviewed. No pertinent family history. History  Substance Use Topics  . Smoking status: Current Every Day Smoker    Types: Cigarettes  . Smokeless tobacco: Not on file  . Alcohol Use: Yes     Comment: occ    Review of Systems  Constitutional: Negative for fever and chills.  Gastrointestinal: Negative for nausea, vomiting and diarrhea.  Genitourinary: Negative for dysuria and enuresis.  Musculoskeletal: Positive for back pain.  Neurological: Negative for weakness and numbness.      Allergies  Review of patient's allergies indicates no known allergies.  Home Medications   Prior to Admission medications   Medication Sig Start Date End Date Taking? Authorizing Provider  cyclobenzaprine (FLEXERIL) 10 MG tablet Take 1 tablet (10 mg total) by mouth 2 (two) times daily as needed for muscle spasms. 06/17/14   Junius FinnerErin O'Malley, PA-C  meloxicam  (MOBIC) 7.5 MG tablet Take 2 tablets (15 mg total) by mouth daily. Take daily for 7 days, then daily as needed for pain. 06/17/14   Junius FinnerErin O'Malley, PA-C   Triage Vitals: BP 142/87  Pulse 65  Temp(Src) 97.8 F (36.6 C) (Oral)  Resp 20  Ht 6\' 3"  (1.905 m)  Wt 221 lb (100.245 kg)  BMI 27.62 kg/m2  SpO2 98%  Physical Exam  Nursing note and vitals reviewed. Constitutional: He is oriented to person, place, and time. He appears well-developed and well-nourished.  HENT:  Head: Normocephalic and atraumatic.  Eyes: EOM are normal.  Neck: Neck supple.  Cardiovascular: Normal rate.   Pulmonary/Chest: Effort normal.  Musculoskeletal: Normal range of motion.  tenderness along lumbar paraspinal muscles and musculature. Antalgic gait. Pain with hip flexion on the left. No midline spinal tenderness.   Neurological: He is alert and oriented to person, place, and time.  Skin: Skin is warm and dry.  Skin intact. No ecchymosis or erythema.   Psychiatric: He has a normal mood and affect. His behavior is normal.    ED Course  Procedures (including critical care time)  COORDINATION OF CARE: 12:48 PM- Discussed treatment plan with patient at bedside and patient agreed to plan.   Labs Review Labs Reviewed - No data to display  Imaging Review No results found.   EKG Interpretation None      MDM   Final diagnoses:  Left-sided low back pain with left-sided sciatica    Patient with back pain radiating  into left leg, hx of similar symptoms. Reports hx of repetitive movements but no direct trauma.  No neurological deficits. Patient is ambulatory. No warning symptoms of back pain including: loss of bowel control, no urinary retention, night sweats, waking from sleep with back pain, unexplained fevers or weight loss, h/o cancer, IVDU, recent trauma. No concern for cauda equina, epidural abscess, or other serious cause of back pain. Conservative measures such as rest, ice/heat and pain medicine  indicated with PCP follow-up if no improvement with conservative management.    I personally performed the services described in this documentation, which was scribed in my presence. The recorded information has been reviewed and is accurate.    Junius Finner, PA-C 06/17/14 1327

## 2014-06-17 NOTE — ED Notes (Signed)
Pt having right lower back pain radiating down leg since yesterday. sts hx of same.

## 2014-06-17 NOTE — ED Provider Notes (Signed)
Medical screening examination/treatment/procedure(s) were performed by non-physician practitioner and as supervising physician I was immediately available for consultation/collaboration.   EKG Interpretation None        Meaghen Vecchiarelli, MD 06/17/14 1523
